# Patient Record
Sex: Male | Born: 1985 | Race: Black or African American | Hispanic: No | Marital: Single | Smoking: Current every day smoker
Health system: Southern US, Community
[De-identification: ages and names within clinical notes are randomized; demographics above are authoritative.]

## PROBLEM LIST (undated history)

## (undated) HISTORY — PX: FOOT SURGERY: SHX648

---
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
## 2003-11-12 ENCOUNTER — Emergency Department (HOSPITAL_COMMUNITY): Admission: EM | Admit: 2003-11-12 | Discharge: 2003-11-13 | Payer: Self-pay | Admitting: Emergency Medicine

## 2007-02-11 ENCOUNTER — Emergency Department (HOSPITAL_COMMUNITY): Admission: EM | Admit: 2007-02-11 | Discharge: 2007-02-11 | Payer: Self-pay | Admitting: Emergency Medicine

## 2007-11-13 ENCOUNTER — Emergency Department (HOSPITAL_COMMUNITY): Admission: EM | Admit: 2007-11-13 | Discharge: 2007-11-13 | Payer: Self-pay | Admitting: Emergency Medicine

## 2008-04-06 ENCOUNTER — Emergency Department (HOSPITAL_COMMUNITY): Admission: EM | Admit: 2008-04-06 | Discharge: 2008-04-07 | Payer: Self-pay | Admitting: Emergency Medicine

## 2009-02-10 ENCOUNTER — Emergency Department (HOSPITAL_COMMUNITY): Admission: EM | Admit: 2009-02-10 | Discharge: 2009-02-10 | Payer: Self-pay | Admitting: Emergency Medicine

## 2009-05-11 ENCOUNTER — Emergency Department (HOSPITAL_COMMUNITY): Admission: EM | Admit: 2009-05-11 | Discharge: 2009-05-11 | Payer: Self-pay | Admitting: Emergency Medicine

## 2009-05-13 ENCOUNTER — Emergency Department (HOSPITAL_COMMUNITY): Admission: EM | Admit: 2009-05-13 | Discharge: 2009-05-13 | Payer: Self-pay | Admitting: Emergency Medicine

## 2010-12-20 ENCOUNTER — Emergency Department (HOSPITAL_COMMUNITY)
Admission: EM | Admit: 2010-12-20 | Discharge: 2010-12-20 | Disposition: A | Discharge status: Home / Self Care | Payer: Self-pay | Attending: Emergency Medicine | Admitting: Emergency Medicine

## 2010-12-20 DIAGNOSIS — L03317 Cellulitis of buttock: Secondary | ICD-10-CM | POA: Insufficient documentation

## 2010-12-20 DIAGNOSIS — L0231 Cutaneous abscess of buttock: Secondary | ICD-10-CM | POA: Insufficient documentation

## 2010-12-22 ENCOUNTER — Emergency Department (HOSPITAL_COMMUNITY)
Admission: EM | Admit: 2010-12-22 | Discharge: 2010-12-22 | Discharge status: Against Medical Advice | Payer: Self-pay | Attending: Emergency Medicine | Admitting: Emergency Medicine

## 2012-08-21 ENCOUNTER — Emergency Department (HOSPITAL_COMMUNITY): Payer: Self-pay

## 2012-08-21 ENCOUNTER — Emergency Department (HOSPITAL_COMMUNITY)
Admission: EM | Admit: 2012-08-21 | Discharge: 2012-08-21 | Disposition: A | Discharge status: Home / Self Care | Payer: Self-pay | Attending: Emergency Medicine | Admitting: Emergency Medicine

## 2012-08-21 DIAGNOSIS — M2392 Unspecified internal derangement of left knee: Secondary | ICD-10-CM

## 2012-08-21 DIAGNOSIS — E669 Obesity, unspecified: Secondary | ICD-10-CM | POA: Insufficient documentation

## 2012-08-21 DIAGNOSIS — M2390 Unspecified internal derangement of unspecified knee: Secondary | ICD-10-CM | POA: Insufficient documentation

## 2012-08-21 MED ORDER — OXYCODONE-ACETAMINOPHEN 5-325 MG PO TABS: 2.0000 | ORAL_TABLET | ORAL | Status: DC | PRN

## 2012-08-21 MED ORDER — OXYCODONE-ACETAMINOPHEN 5-325 MG PO TABS
2.0000 | ORAL_TABLET | Freq: Once | ORAL | Status: AC
  Administered 2012-08-21: 2 via ORAL
  Filled 2012-08-21: qty 2

## 2012-08-21 NOTE — ED Notes (Signed)
Ortho tech at bedside for application of splint and knee immobilizer.

## 2012-08-21 NOTE — ED Notes (Signed)
Pt alert and oriented x4. Respirations even and unlabored, bilateral symmetrical rise and fall of chest. Skin warm and dry. In no acute distress. Denies needs.   

## 2012-08-21 NOTE — ED Notes (Addendum)
Pt states he was playing football and got tackled low on L side of leg. Pt states he has been unable to bear wt on L leg. Pt in wheelchair at triage.

## 2012-08-21 NOTE — ED Provider Notes (Signed)
History     CSN: [id number]  Arrival date & time 08/21/12  1614   First MD Initiated Contact with Patient 08/21/12 1644      No chief complaint on file.   (Consider location/radiation/quality/duration/timing/severity/associated sxs/prior treatment) HPI Comments: Patient was playing football, when he got clipped from side, twisting.  His left knee, which is now painful.  He is unable to bear weight, denies any previous injury to that knee.  Has not taken any medication.  Prior to arrival  The history is provided by the patient.    No past medical history on file.  No past surgical history on file.  No family history on file.  History  Substance Use Topics  . Smoking status: Not on file  . Smokeless tobacco: Not on file  . Alcohol Use: Not on file      Review of Systems  Constitutional: Negative for chills and activity change.  Musculoskeletal: Positive for joint swelling.  Neurological: Negative for weakness and numbness.    Allergies  Review of patient's allergies indicates no known allergies.  Home Medications   Current Outpatient Rx  Name Route Sig Dispense Refill  . OXYCODONE-ACETAMINOPHEN 5-325 MG PO TABS Oral Take 2 tablets by mouth every 4 (four) hours as needed for pain. 30 tablet 0    BP 130/84  Pulse 99  Temp 98.6 F (37 C) (Oral)  Resp 16  SpO2 98%  Physical Exam  Constitutional: He appears well-developed and well-nourished.       Obese  HENT:  Head: Normocephalic.  Eyes: Pupils are equal, round, and reactive to light.  Neck: Normal range of motion.  Cardiovascular: Normal rate.   Pulmonary/Chest: Effort normal.  Musculoskeletal: He exhibits tenderness.       Legs: Neurological: He is alert.  Skin: Skin is warm. No erythema.    ED Course  Procedures (including critical care time)  Labs Reviewed - No data to display Dg Knee Complete 4 Views Left  08/21/2012  *RADIOLOGY REPORT*  Clinical Data: Injury  LEFT KNEE - COMPLETE 4+ VIEW   Comparison: None.  Findings: Four views of the left knee submitted.  No acute fracture or subluxation.  No joint effusion.  No radiopaque foreign body.  IMPRESSION: No acute fracture or subluxation.   Original Report Authenticated By: Natasha Mead, M.D.      1. Internal derangement of left knee       MDM  Knee injury  X-rays were reviewed, revealing no fracture, effusion, or subluxation.  We'll treat as an internal derangement with pain medication, immobilization, and orthopedic followup        Arman Filter, NP 08/21/12 1931

## 2012-08-22 NOTE — ED Provider Notes (Signed)
Medical screening examination/treatment/procedure(s) were performed by non-physician practitioner and as supervising physician I was immediately available for consultation/collaboration.  Toshiko Kemler R. Stevin Bielinski, MD 08/22/12 0013 

## 2012-12-11 ENCOUNTER — Emergency Department (HOSPITAL_COMMUNITY)
Admission: EM | Admit: 2012-12-11 | Discharge: 2012-12-11 | Disposition: A | Discharge status: Home / Self Care | Payer: Self-pay | Attending: Emergency Medicine | Admitting: Emergency Medicine

## 2012-12-11 ENCOUNTER — Encounter (HOSPITAL_COMMUNITY): Payer: Self-pay | Admitting: Emergency Medicine

## 2012-12-11 ENCOUNTER — Emergency Department (HOSPITAL_COMMUNITY): Payer: Self-pay

## 2012-12-11 DIAGNOSIS — S8990XA Unspecified injury of unspecified lower leg, initial encounter: Secondary | ICD-10-CM | POA: Insufficient documentation

## 2012-12-11 DIAGNOSIS — S99929A Unspecified injury of unspecified foot, initial encounter: Secondary | ICD-10-CM | POA: Insufficient documentation

## 2012-12-11 DIAGNOSIS — X500XXA Overexertion from strenuous movement or load, initial encounter: Secondary | ICD-10-CM | POA: Insufficient documentation

## 2012-12-11 DIAGNOSIS — Y9361 Activity, american tackle football: Secondary | ICD-10-CM | POA: Insufficient documentation

## 2012-12-11 DIAGNOSIS — Y9239 Other specified sports and athletic area as the place of occurrence of the external cause: Secondary | ICD-10-CM | POA: Insufficient documentation

## 2012-12-11 MED ORDER — HYDROCODONE-ACETAMINOPHEN 5-325 MG PO TABS: 2.0000 | ORAL_TABLET | Freq: Four times a day (QID) | ORAL | Status: DC | PRN

## 2012-12-11 NOTE — ED Notes (Signed)
Pt states that he was playing football about 1800 today and hurt his left knee.  When asked if he fell, pt states "them fat people jumped on me".  Pain 6/10.

## 2012-12-11 NOTE — ED Provider Notes (Signed)
History  This chart was scribed for non-physician practitioner working with Dione Booze, MD by Candelaria Stagers, ED Scribe. This patient was seen in room WTR9/WTR9 and the patient's care was started at 8:00 PM  CSN: [id number]  Arrival date & time 12/11/12  4098   First MD Initiated Contact with Patient 12/11/12 1940      Chief Complaint  Patient presents with  . Knee Pain     The history is provided by the patient. No language interpreter was used.   Steven Nash is a 27 y.o. male who presents to the Emergency Department complaining of sudden onset of left knee pain that started earlier today while playing football.  Pt reports another player jumped on him and he felt two pops of his knee.  He sprained the same knee about two months ago and was given a knee immobilizer, which he has thrown away.  He reports that the pain improved and he did not see an Orthopedist.  Pt is ambulatory, but has increased pain with ambulation.  Nothing improves the pain.  He has no other injuries.  Pt asks for a work note.  He denies any numbness or tingling.  He reports that he has noticed some swelling of the knee.  He denies erythema.   History reviewed. No pertinent past medical history.  History reviewed. No pertinent past surgical history.  History reviewed. No pertinent family history.  History  Substance Use Topics  . Smoking status: Never Smoker   . Smokeless tobacco: Not on file  . Alcohol Use: No      Review of Systems  Musculoskeletal: Positive for arthralgias (left knee pain).  Skin: Negative for wound.  All other systems reviewed and are negative.    Allergies  Review of patient's allergies indicates no known allergies.  Home Medications  No current outpatient prescriptions on file.  BP 129/85  Pulse 95  Temp(Src) 98.1 F (36.7 C) (Oral)  Resp 18  SpO2 99%  Physical Exam  Nursing note and vitals reviewed. Constitutional: He is oriented to person, place, and time. He  appears well-developed and well-nourished. No distress.  HENT:  Head: Normocephalic and atraumatic.  Eyes: EOM are normal.  Neck: Neck supple. No tracheal deviation present.  Cardiovascular: Normal rate.   Pulmonary/Chest: Effort normal. No respiratory distress.  Musculoskeletal: Normal range of motion.       Left knee: He exhibits no erythema, no LCL laxity, normal patellar mobility and no MCL laxity.  Tenderness to palpation along the medial and lateral joint line.  No erythema present.  Negative anterior and posterior drawer test.  Pain with varus and valgus stress. Good dorsal pedis pulses.  Distal sensation intact.   Pain with ROM of the left knee. Mild swelling of the left knee  Neurological: He is alert and oriented to person, place, and time.  Skin: Skin is warm and dry.  Psychiatric: He has a normal mood and affect. His behavior is normal.    ED Course  Procedures   DIAGNOSTIC STUDIES: Oxygen Saturation is 99% on room air, normal by my interpretation.    COORDINATION OF CARE: 6:53 PM Ordered: DG Knee Complete 4 Views 8:05 PM Will provide knee immobilizer and crutches.  Will provide referral to orthopaedist for follow up if pain persists.  Pt understands and agrees.   8:15 PM Ordered: Apply knee immobilizer; Crutches   Labs Reviewed - No data to display Dg Knee Complete 4 Views Left  12/11/2012  *RADIOLOGY REPORT*  Clinical Data: Diffuse knee pain.  Football injury with a popping sensation.  LEFT KNEE - COMPLETE 4+ VIEW  Comparison: 08/21/2012  Findings: Small indistinct knee effusion suspected.  Articular space is well preserved.  No fracture or acute bony findings.  IMPRESSION:  1.  Small indistinct knee effusion.  If symptoms persist despite conservative therapy, MRI followup may be warranted.   Original Report Authenticated By: Gaylyn Rong, M.D.      No diagnosis found.    MDM  Patient presenting with left knee pain after injuring his knee playing football  earlier today.  No obvious deformity.  Xray negative aside from a knee effusion.  Patient neurovascularly intact.  Patient given knee immobilizer, crutches, and instructed to follow up with Orthopedics.  I personally performed the services described in this documentation, which was scribed in my presence. The recorded information has been reviewed and is accurate.        Pascal Lux [place], PA-C 12/11/12 2233

## 2012-12-11 NOTE — ED Provider Notes (Signed)
Medical screening examination/treatment/procedure(s) were performed by non-physician practitioner and as supervising physician I was immediately available for consultation/collaboration.   Dione Booze, MD 12/11/12 2241

## 2012-12-11 NOTE — ED Notes (Signed)
Ortho tech called and made aware of pt needs for knee immobilizer and crutches

## 2015-07-17 ENCOUNTER — Emergency Department (HOSPITAL_COMMUNITY)
Admission: EM | Admit: 2015-07-17 | Discharge: 2015-07-18 | Disposition: A | Discharge status: Home / Self Care | Payer: Self-pay | Attending: Emergency Medicine | Admitting: Emergency Medicine

## 2015-07-17 ENCOUNTER — Encounter (HOSPITAL_COMMUNITY): Payer: Self-pay | Admitting: Emergency Medicine

## 2015-07-17 DIAGNOSIS — R040 Epistaxis: Secondary | ICD-10-CM | POA: Insufficient documentation

## 2015-07-17 NOTE — ED Notes (Addendum)
Pt c/o epistaxis x3 weeks, lasting 10 minutes in length. Nose bleeds accompanied with dizziness and HA. Reports last nose bleed was at 1500 today. Pt states before nose bleed left ring finger will start to become numb.

## 2015-07-17 NOTE — ED Notes (Signed)
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 He has been having nosebleeds while at work along with a throbbing headache and a numb ring finger on his left hand. This only occurs when he is at work and in the freezer.

## 2015-07-18 LAB — CBC WITH DIFFERENTIAL/PLATELET
BASOS ABS: 0 10*3/uL (ref 0.0–0.1)
Basophils Relative: 0 %
Eosinophils Absolute: 0.4 10*3/uL (ref 0.0–0.7)
Eosinophils Relative: 5 %
HEMATOCRIT: 42.2 % (ref 39.0–52.0)
HEMOGLOBIN: 13.9 g/dL (ref 13.0–17.0)
LYMPHS PCT: 44 %
Lymphs Abs: 3.6 10*3/uL (ref 0.7–4.0)
MCH: 26.8 pg (ref 26.0–34.0)
MCHC: 32.9 g/dL (ref 30.0–36.0)
MCV: 81.3 fL (ref 78.0–100.0)
MONO ABS: 0.5 10*3/uL (ref 0.1–1.0)
MONOS PCT: 6 %
NEUTROS ABS: 3.6 10*3/uL (ref 1.7–7.7)
NEUTROS PCT: 45 %
Platelets: 280 10*3/uL (ref 150–400)
RBC: 5.19 MIL/uL (ref 4.22–5.81)
RDW: 13.3 % (ref 11.5–15.5)
WBC: 8.1 10*3/uL (ref 4.0–10.5)

## 2015-07-18 NOTE — ED Provider Notes (Signed)
CSN: [id number]     Arrival date & time 07/17/15  1830 History   First MD Initiated Contact with Patient 07/17/15 2338     Chief Complaint  Patient presents with  . Epistaxis  . Dizziness     (Consider location/radiation/quality/duration/timing/severity/associated sxs/prior Treatment) HPI Comments: 29 y.o. Male with no significant past medical history presents for epistaxis.  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  He says this only happens to him while he is working in the freezer and that the episodes last about 10 minutes and then resolved.  He denies ever having this issues before.  He reports that when his nose bleeds it is from both nostrils.  This happened again today and resolved again on its own.  Denies easy bruising or other issue with bleeding.  Patient is a 29 y.o. male presenting with nosebleeds and dizziness.  Epistaxis Associated symptoms: dizziness   Associated symptoms: no congestion, no cough, no fever, no headaches and no sore throat   Dizziness Associated symptoms: no blood in stool, no chest pain, no diarrhea, no headaches, no nausea, no palpitations, no shortness of breath, no vomiting and no weakness     History reviewed. No pertinent past medical history. Past Surgical History  Procedure Laterality Date  . Foot surgery     No family history on file. Social History  Substance Use Topics  . Smoking status: Never Smoker   . Smokeless tobacco: None  . Alcohol Use: Yes    Review of Systems  Constitutional: Negative for fever, chills, activity change, appetite change and fatigue.  HENT: Positive for nosebleeds. Negative for congestion, postnasal drip, rhinorrhea and sore throat.   Eyes: Negative for pain and redness.  Respiratory: Negative for cough, chest tightness and shortness of breath.    Cardiovascular: Negative for chest pain and palpitations.  Gastrointestinal: Negative for nausea, vomiting, abdominal pain, diarrhea and blood in stool.  Genitourinary: Negative for hematuria.  Neurological: Positive for dizziness and light-headedness. Negative for seizures, weakness, numbness and headaches.  Hematological: Negative for adenopathy. Does not bruise/bleed easily.      Allergies  Review of patient's allergies indicates no known allergies.  Home Medications   Prior to Admission medications   Medication Sig Start Date End Date Taking? Authorizing Provider  HYDROcodone-acetaminophen (NORCO/VICODIN) 5-325 MG per tablet Take 2 tablets by mouth every 6 (six) hours as needed for pain. 12/11/12   Heather Laisure, PA-C   BP 138/87 mmHg  Pulse 66  Temp(Src) 98 F (36.7 C) (Oral)  Resp 18  SpO2 100% Physical Exam  Constitutional: He is oriented to person, place, and time. He appears well-developed and well-nourished. No distress.  HENT:  Head: Normocephalic and atraumatic.  Right Ear: External ear normal.  Left Ear: External ear normal.  Nose: Nose normal. No mucosal edema, rhinorrhea, nose lacerations, nasal deformity or nasal septal hematoma. No epistaxis.  Mouth/Throat: Oropharynx is clear and moist. No oropharyngeal exudate.  Nostrils clear without sign of epistaxis.  No friable blood vessels noted.  No blood in the posterior pharynx.  Cardiovascular: Normal rate, regular rhythm, normal heart sounds and intact distal pulses.   No murmur heard. Pulmonary/Chest: Effort normal. No respiratory distress. He has no wheezes. He has no rales.  Abdominal: Soft. He exhibits no distension. There is no tenderness.  Musculoskeletal: Normal range of motion. He exhibits no edema or tenderness.  Neurological: He is alert and oriented to person, place, and time.  Skin: Skin is warm and dry. No bruising, no petechiae and no rash noted. He is not diaphoretic.  Vitals reviewed.   ED  Course  Procedures (including critical care time) Labs Review Labs Reviewed  CBC WITH DIFFERENTIAL/PLATELET    Imaging Review No results found. I have personally reviewed and evaluated these images and lab results as part of my medical decision-making.   EKG Interpretation None      MDM  Patient was seen and evaluated in stable condition.  Normal examination.  Mild tenderness of the maxillary and frontal sinuses.  No active epistaxis.  Episodes seem to be environmental induced.  Patient stable.  CBC normal.  Patient was discharged home in stable condition with instruction to follow up with ENT.  Strict return precautions given. Final diagnoses:  Epistaxis    1. Epistaxis, recurrent, currently resolved    Leta Baptist, MD 07/18/15 0236

## 2015-07-18 NOTE — Discharge Instructions (Signed)
Nosebleed Nosebleeds can be caused by many conditions, including trauma, infections, polyps, foreign bodies, dry mucous membranes or climate, medicines, and air conditioning. Most nosebleeds occur in the front of the nose. Because of this location, most nosebleeds can be controlled by pinching the nostrils gently and continuously for at least 10 to 20 minutes. The long, continuous pressure allows enough time for the blood to clot. If pressure is released during that 10 to 20 minute time period, the process may have to be started again. The nosebleed may stop by itself or quit with pressure, or it may need concentrated heating (cautery) or pressure from packing. HOME CARE INSTRUCTIONS   If your nose was packed, try to maintain the pack inside until your health care provider removes it. If a gauze pack was used and it starts to fall out, gently replace it or cut the end off. Do not cut if a balloon catheter was used to pack the nose. Otherwise, do not remove unless instructed.  Avoid blowing your nose for 12 hours after treatment. This could dislodge the pack or clot and start the bleeding again.  If the bleeding starts again, sit up and bend forward, gently pinching the front half of your nose continuously for 20 minutes.  If bleeding was caused by dry mucous membranes, use over-the-counter saline nasal spray or gel. This will keep the mucous membranes moist and allow them to heal. If you must use a lubricant, choose the water-soluble variety. Use it only sparingly and not within several hours of lying down.  Maintain humidity in your home by using less air conditioning or by using a humidifier.  Do not use aspirin or medicines which make bleeding more likely. Your health care provider can give you recommendations on this.  Resume normal activities as you are able, but try to avoid straining, lifting, or bending at the waist for several days.  If the nosebleeds become recurrent and the cause is  unknown, your health care provider may suggest laboratory tests.  You may use over the counter nasal sprays to keep the inside of your nose moist, wear a mask to keep your face warm SEEK MEDICAL CARE IF: You have a fever. SEEK IMMEDIATE MEDICAL CARE IF:   Bleeding recurs and cannot be controlled.  There is unusual bleeding from or bruising on other parts of the body.  Nosebleeds continue.  There is any worsening of the condition which originally brought you in.  You become light-headed, feel faint, become sweaty, or vomit blood. MAKE SURE YOU:   Understand these instructions.  Will watch your condition.  Will get help right away if you are not doing well or get worse. Document Released: 07/29/2005 Document Revised: 03/05/2014 Document Reviewed: 09/19/2009 [facility] Patient Information 2015 [place], [place]. This information is not intended to replace advice given to you by your health care provider. Make sure you discuss any questions you have with your health care provider.

## 2015-09-12 ENCOUNTER — Encounter (HOSPITAL_COMMUNITY): Payer: Self-pay | Admitting: *Deleted

## 2015-09-12 ENCOUNTER — Emergency Department (HOSPITAL_COMMUNITY): Payer: Managed Care, Other (non HMO)

## 2015-09-12 ENCOUNTER — Emergency Department (HOSPITAL_COMMUNITY)
Admission: EM | Admit: 2015-09-12 | Discharge: 2015-09-12 | Disposition: A | Discharge status: Home / Self Care | Payer: Managed Care, Other (non HMO) | Attending: Emergency Medicine | Admitting: Emergency Medicine

## 2015-09-12 DIAGNOSIS — R944 Abnormal results of kidney function studies: Secondary | ICD-10-CM | POA: Insufficient documentation

## 2015-09-12 DIAGNOSIS — R05 Cough: Secondary | ICD-10-CM | POA: Diagnosis not present

## 2015-09-12 DIAGNOSIS — R0602 Shortness of breath: Secondary | ICD-10-CM | POA: Insufficient documentation

## 2015-09-12 DIAGNOSIS — R519 Headache, unspecified: Secondary | ICD-10-CM

## 2015-09-12 DIAGNOSIS — Z720 Tobacco use: Secondary | ICD-10-CM | POA: Insufficient documentation

## 2015-09-12 DIAGNOSIS — Z79899 Other long term (current) drug therapy: Secondary | ICD-10-CM | POA: Diagnosis not present

## 2015-09-12 DIAGNOSIS — R062 Wheezing: Secondary | ICD-10-CM | POA: Insufficient documentation

## 2015-09-12 DIAGNOSIS — R002 Palpitations: Secondary | ICD-10-CM | POA: Insufficient documentation

## 2015-09-12 DIAGNOSIS — R51 Headache: Secondary | ICD-10-CM | POA: Insufficient documentation

## 2015-09-12 DIAGNOSIS — R040 Epistaxis: Secondary | ICD-10-CM | POA: Insufficient documentation

## 2015-09-12 DIAGNOSIS — R079 Chest pain, unspecified: Secondary | ICD-10-CM | POA: Diagnosis present

## 2015-09-12 DIAGNOSIS — R7989 Other specified abnormal findings of blood chemistry: Secondary | ICD-10-CM

## 2015-09-12 LAB — I-STAT TROPONIN, ED: TROPONIN I, POC: 0 ng/mL (ref 0.00–0.08)

## 2015-09-12 LAB — BASIC METABOLIC PANEL
ANION GAP: 11 (ref 5–15)
BUN: 14 mg/dL (ref 6–20)
CALCIUM: 9.5 mg/dL (ref 8.9–10.3)
CO2: 23 mmol/L (ref 22–32)
Chloride: 105 mmol/L (ref 101–111)
Creatinine, Ser: 1.34 mg/dL — ABNORMAL HIGH (ref 0.61–1.24)
GFR calc Af Amer: >60 mL/min (ref >60)
GFR calc non Af Amer: >60 mL/min (ref >60)
GLUCOSE: 108 mg/dL — AB (ref 65–99)
Potassium: 4 mmol/L (ref 3.5–5.1)
Sodium: 139 mmol/L (ref 135–145)

## 2015-09-12 LAB — CBC
HCT: 41.2 % (ref 39.0–52.0)
HEMOGLOBIN: 13.4 g/dL (ref 13.0–17.0)
MCH: 26.4 pg (ref 26.0–34.0)
MCHC: 32.5 g/dL (ref 30.0–36.0)
MCV: 81.1 fL (ref 78.0–100.0)
Platelets: 277 10*3/uL (ref 150–400)
RBC: 5.08 MIL/uL (ref 4.22–5.81)
RDW: 13.3 % (ref 11.5–15.5)
WBC: 6.3 10*3/uL (ref 4.0–10.5)

## 2015-09-12 MED ORDER — ALBUTEROL SULFATE HFA 108 (90 BASE) MCG/ACT IN AERS: 2.0000 | INHALATION_SPRAY | RESPIRATORY_TRACT | Status: AC | PRN

## 2015-09-12 MED ORDER — DIPHENHYDRAMINE HCL 50 MG/ML IJ SOLN
25.0000 mg | Freq: Once | INTRAMUSCULAR | Status: AC
  Administered 2015-09-12: 25 mg via INTRAVENOUS
  Filled 2015-09-12: qty 1

## 2015-09-12 MED ORDER — ALBUTEROL SULFATE HFA 108 (90 BASE) MCG/ACT IN AERS
2.0000 | INHALATION_SPRAY | Freq: Once | RESPIRATORY_TRACT | Status: DC
  Filled 2015-09-12: qty 6.7

## 2015-09-12 MED ORDER — SODIUM CHLORIDE 0.9 % IV BOLUS (SEPSIS)
1000.0000 mL | Freq: Once | INTRAVENOUS | Status: AC
  Administered 2015-09-12: 1000 mL via INTRAVENOUS

## 2015-09-12 MED ORDER — ALBUTEROL SULFATE (2.5 MG/3ML) 0.083% IN NEBU
5.0000 mg | INHALATION_SOLUTION | Freq: Once | RESPIRATORY_TRACT | Status: AC
  Administered 2015-09-12: 5 mg via RESPIRATORY_TRACT
  Filled 2015-09-12: qty 6

## 2015-09-12 MED ORDER — IPRATROPIUM BROMIDE 0.02 % IN SOLN
0.5000 mg | Freq: Once | RESPIRATORY_TRACT | Status: AC
  Administered 2015-09-12: 0.5 mg via RESPIRATORY_TRACT
  Filled 2015-09-12: qty 2.5

## 2015-09-12 MED ORDER — METOCLOPRAMIDE HCL 5 MG/ML IJ SOLN
10.0000 mg | Freq: Once | INTRAMUSCULAR | Status: AC
  Administered 2015-09-12: 10 mg via INTRAVENOUS
  Filled 2015-09-12: qty 2

## 2015-09-12 NOTE — ED Notes (Signed)
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  The 2nd nose bleed was accompanied by a headache in his temples, chest pain, diaphoresis and increased chest pain when he tried to take a deep breath.

## 2015-09-12 NOTE — ED Notes (Signed)
Patient left at this time with all belongings. 

## 2015-09-12 NOTE — Discharge Instructions (Signed)
Nonspecific Chest Pain  Chest pain can be caused by many different conditions. There is always a chance that your pain could be related to something serious, such as a heart attack or a blood clot in your lungs. Chest pain can also be caused by conditions that are not life-threatening. If you have chest pain, it is very important to follow up with your health care provider. CAUSES  Chest pain can be caused by:  Heartburn.  Pneumonia or bronchitis.  Anxiety or stress.  Inflammation around your heart (pericarditis) or lung (pleuritis or pleurisy).  A blood clot in your lung.  A collapsed lung (pneumothorax). It can develop suddenly on its own (spontaneous pneumothorax) or from trauma to the chest.  Shingles infection (varicella-zoster virus).  Heart attack.  Damage to the bones, muscles, and cartilage that make up your chest wall. This can include:  Bruised bones due to injury.  Strained muscles or cartilage due to frequent or repeated coughing or overwork.  Fracture to one or more ribs.  Sore cartilage due to inflammation (costochondritis). RISK FACTORS  Risk factors for chest pain may include:  Activities that increase your risk for trauma or injury to your chest.  Respiratory infections or conditions that cause frequent coughing.  Medical conditions or overeating that can cause heartburn.  Heart disease or family history of heart disease.  Conditions or health behaviors that increase your risk of developing a blood clot.  Having had chicken pox (varicella zoster). SIGNS AND SYMPTOMS Chest pain can feel like:  Burning or tingling on the surface of your chest or deep in your chest.  Crushing, pressure, aching, or squeezing pain.  Dull or sharp pain that is worse when you move, cough, or take a deep breath.  Pain that is also felt in your back, neck, shoulder, or arm, or pain that spreads to any of these areas. Your chest pain may come and go, or it may stay  constant. DIAGNOSIS Lab tests or other studies may be needed to find the cause of your pain. Your health care provider may have you take a test called an ambulatory ECG (electrocardiogram). An ECG records your heartbeat patterns at the time the test is performed. You may also have other tests, such as:  Transthoracic echocardiogram (TTE). During echocardiography, sound waves are used to create a picture of all of the heart structures and to look at how blood flows through your heart.  Transesophageal echocardiogram (TEE).This is a more advanced imaging test that obtains images from inside your body. It allows your health care provider to see your heart in finer detail.  Cardiac monitoring. This allows your health care provider to monitor your heart rate and rhythm in real time.  Holter monitor. This is a portable device that records your heartbeat and can help to diagnose abnormal heartbeats. It allows your health care provider to track your heart activity for several days, if needed.  Stress tests. These can be done through exercise or by taking medicine that makes your heart beat more quickly.  Blood tests.  Imaging tests. TREATMENT  Your treatment depends on what is causing your chest pain. Treatment may include:  Medicines. These may include:  Acid blockers for heartburn.  Anti-inflammatory medicine.  Pain medicine for inflammatory conditions.  Antibiotic medicine, if an infection is present.  Medicines to dissolve blood clots.  Medicines to treat coronary artery disease.  Supportive care for conditions that do not require medicines. This may include:  Resting.  Applying heat  or cold packs to injured areas.  Limiting activities until pain decreases. HOME CARE INSTRUCTIONS  If you were prescribed an antibiotic medicine, finish it all even if you start to feel better.  Avoid any activities that bring on chest pain.  Do not use any tobacco products, including  cigarettes, chewing tobacco, or electronic cigarettes. If you need help quitting, ask your health care provider.  Do not drink alcohol.  Take medicines only as directed by your health care provider.  Keep all follow-up visits as directed by your health care provider. This is important. This includes any further testing if your chest pain does not go away.  If heartburn is the cause for your chest pain, you may be told to keep your head raised (elevated) while sleeping. This reduces the chance that acid will go from your stomach into your esophagus.  Make lifestyle changes as directed by your health care provider. These may include:  Getting regular exercise. Ask your health care provider to suggest some activities that are safe for you.  Eating a heart-healthy diet. A registered dietitian can help you to learn healthy eating options.  Maintaining a healthy weight.  Managing diabetes, if necessary.  Reducing stress. SEEK MEDICAL CARE IF:  Your chest pain does not go away after treatment.  You have a rash with blisters on your chest.  You have a fever. SEEK IMMEDIATE MEDICAL CARE IF:   Your chest pain is worse.  You have an increasing cough, or you cough up blood.  You have severe abdominal pain.  You have severe weakness.  You faint.  You have chills.  You have sudden, unexplained chest discomfort.  You have sudden, unexplained discomfort in your arms, back, neck, or jaw.  You have shortness of breath at any time.  You suddenly start to sweat, or your skin gets clammy.  You feel nauseous or you vomit.  You suddenly feel light-headed or dizzy.  Your heart begins to beat quickly, or it feels like it is skipping beats. These symptoms may represent a serious problem that is an emergency. Do not wait to see if the symptoms will go away. Get medical help right away. Call your local emergency services (911 in the [place] Do not drive yourself to the hospital.   This  information is not intended to replace advice given to you by your health care provider. Make sure you discuss any questions you have with your health care provider.   Document Released: 07/29/2005 Document Revised: 11/09/2014 Document Reviewed: 05/25/2014 Elsevier Interactive Patient Education 2016 [employer] Bronchospasm, Adult A bronchospasm is when the tubes that carry air in and out of your lungs (airways) spasm or tighten. During a bronchospasm it is hard to breathe. This is because the airways get smaller. A bronchospasm can be triggered by:  Allergies. These may be to animals, pollen, food, or mold.  Infection. This is a common cause of bronchospasm.  Exercise.  Irritants. These include pollution, cigarette smoke, strong odors, aerosol sprays, and paint fumes.  Weather changes.  Stress.  Being emotional. HOME CARE   Always have a plan for getting help. Know when to call your doctor and local emergency services (911 in the [place] Know where you can get emergency care.  Only take medicines as told by your doctor.  If you were prescribed an inhaler or nebulizer machine, ask your doctor how to use it correctly. Always use a spacer with your inhaler if you were given one.  Stay calm  during an attack. Try to relax and breathe more slowly.  Control your home environment:  Change your heating and air conditioning filter at least once a month.  Limit your use of fireplaces and wood stoves.  Do not  smoke. Do not  allow smoking in your home.  Avoid perfumes and fragrances.  Get rid of pests (such as roaches and mice) and their droppings.  Throw away plants if you see mold on them.  Keep your house clean and dust free.  Replace carpet with wood, tile, or vinyl flooring. Carpet can trap dander and dust.  Use allergy-proof pillows, mattress covers, and box spring covers.  Wash bed sheets and blankets every week in hot water. Dry them in a dryer.  Use blankets that  are made of polyester or cotton.  Wash hands frequently. GET HELP IF:  You have muscle aches.  You have chest pain.  The thick spit you spit or cough up (sputum) changes from clear or white to yellow, green, gray, or bloody.  The thick spit you spit or cough up gets thicker.  There are problems that may be related to the medicine you are given such as:  A rash.  Itching.  Swelling.  Trouble breathing. GET HELP RIGHT AWAY IF:  You feel you cannot breathe or catch your breath.  You cannot stop coughing.  Your treatment is not helping you breathe better.  You have very bad chest pain. MAKE SURE YOU:   Understand these instructions.  Will watch your condition.  Will get help right away if you are not doing well or get worse.   This information is not intended to replace advice given to you by your health care provider. Make sure you discuss any questions you have with your health care provider.   Document Released: 08/16/2009 Document Revised: 11/09/2014 Document Reviewed: 04/11/2013 Elsevier Interactive Patient Education 2016 [employer] General Headache Without Cause A headache is pain or discomfort felt around the head or neck area. The specific cause of a headache may not be found. There are many causes and types of headaches. A few common ones are:  Tension headaches.  Migraine headaches.  Cluster headaches.  Chronic daily headaches. HOME CARE INSTRUCTIONS  Watch your condition for any changes. Take these steps to help with your condition: Managing Pain  Take over-the-counter and prescription medicines only as told by your health care provider.  Lie down in a dark, quiet room when you have a headache.  If directed, apply ice to the head and neck area:  Put ice in a plastic bag.  Place a towel between your skin and the bag.  Leave the ice on for 20 minutes, 2-3 times per day.  Use a heating pad or hot shower to apply heat to the head and neck  area as told by your health care provider.  Keep lights dim if bright lights bother you or make your headaches worse. Eating and Drinking  Eat meals on a regular schedule.  Limit alcohol use.  Decrease the amount of caffeine you drink, or stop drinking caffeine. General Instructions  Keep all follow-up visits as told by your health care provider. This is important.  Keep a headache journal to help find out what may trigger your headaches. For example, write down:  What you eat and drink.  How much sleep you get.  Any change to your diet or medicines.  Try massage or other relaxation techniques.  Limit stress.  Sit up straight,  and do not tense your muscles.  Do not use tobacco products, including cigarettes, chewing tobacco, or e-cigarettes. If you need help quitting, ask your health care provider.  Exercise regularly as told by your health care provider.  Sleep on a regular schedule. Get 7-9 hours of sleep, or the amount recommended by your health care provider. SEEK MEDICAL CARE IF:   Your symptoms are not helped by medicine.  You have a headache that is different from the usual headache.  You have nausea or you vomit.  You have a fever. SEEK IMMEDIATE MEDICAL CARE IF:   Your headache becomes severe.  You have repeated vomiting.  You have a stiff neck.  You have a loss of vision.  You have problems with speech.  You have pain in the eye or ear.  You have muscular weakness or loss of muscle control.  You lose your balance or have trouble walking.  You feel faint or pass out.  You have confusion.   This information is not intended to replace advice given to you by your health care provider. Make sure you discuss any questions you have with your health care provider.   Document Released: 10/19/2005 Document Revised: 07/10/2015 Document Reviewed: 02/11/2015 Elsevier Interactive Patient Education [employer]

## 2015-09-12 NOTE — ED Provider Notes (Signed)
CSN: [id number]     Arrival date & time 09/12/15  1530 History   First MD Initiated Contact with Patient 09/12/15 1536     Chief Complaint  Patient presents with  . Chest Pain  . Headache   Steven Nash is a 29 y.o. male with a history of obesity and marijuana usage presents to the emergency department complaining of a nose bleed earlier today that resolved, then a second brief nose bleed that caused him to develop chest pain, and a headache. 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 He reports today he had a second nosebleed and then developed substernal 7 out of 10 stabbing chest pain that is nonradiating. He also reports an associated 8 out of 10 frontal and throbbing headache. He reports associated cough, intermittent wheezing, and pain with deep inspiration. He reports he feels slightly short of breath currently. He reports some palpitations earlier with this chest pain. The patient reports he does smoke marijuana but not tobacco. Patient denies fevers, chills, recent illness, history of asthma, abdominal pain, nausea, vomiting, leg pain, leg swelling, numbness, tingling, weakness, also bladder control, loss of bowel control, or rashes. Denies personal or close family history of DVTs or PEs. He reports a history with a father with a heart attack at age 31. He denies easy bruising or easy bleeding.  (Consider location/radiation/quality/duration/timing/severity/associated sxs/prior Treatment) HPI  History reviewed. No pertinent past medical history. Past Surgical History  Procedure Laterality Date  . Foot surgery     No family history on file. Social History  Substance Use Topics  . Smoking status: Current Every Day Smoker    Types: Cigarettes  . Smokeless tobacco: None  . Alcohol Use: Yes    Review of Systems  Constitutional: Negative for fever and chills.  HENT: Negative for congestion, sore throat  and trouble swallowing.   Eyes: Negative for visual disturbance.  Respiratory: Positive for cough and shortness of breath. Negative for chest tightness and wheezing.   Cardiovascular: Positive for chest pain and palpitations. Negative for leg swelling.  Gastrointestinal: Negative for nausea, vomiting, abdominal pain and diarrhea.  Genitourinary: Negative for dysuria, urgency, frequency, hematuria, decreased urine volume and difficulty urinating.  Musculoskeletal: Negative for back pain, neck pain and neck stiffness.  Skin: Negative for rash.  Neurological: Positive for headaches. Negative for dizziness, syncope, weakness, light-headedness and numbness.      Allergies  Review of patient's allergies indicates no known allergies.  Home Medications   Prior to Admission medications   Medication Sig Start Date End Date Taking? Authorizing Provider  albuterol (PROVENTIL HFA;VENTOLIN HFA) 108 (90 BASE) MCG/ACT inhaler Inhale 2 puffs into the lungs every 4 (four) hours as needed for wheezing or shortness of breath. 09/12/15   Everlene FarrierWilliam Kaja Jackowski, PA-C   BP 149/89 mmHg  Pulse 89  Temp(Src) 98 F (36.7 C) (Oral)  Resp 14  Ht 5\' 9"  (1.753 m)  Wt 337 lb (152.862 kg)  BMI 49.74 kg/m2  SpO2 100% Physical Exam  Constitutional: He is oriented to person, place, and time. He appears well-developed and well-nourished. No distress.  Nontoxic appearing.  HENT:  Head: Normocephalic and atraumatic.  Mouth/Throat: Oropharynx is clear and moist.  Eyes: Conjunctivae and EOM are normal. Pupils are equal, round, and reactive to light. Right eye exhibits no discharge. Left eye exhibits no discharge.  Neck: Neck supple.  Cardiovascular: Normal rate, regular rhythm, normal heart sounds and  intact distal pulses.  Exam reveals no gallop and no friction rub.   No murmur heard. Pulmonary/Chest: Effort normal and breath sounds normal. No respiratory distress. He has no wheezes. He has no rales. He exhibits  tenderness.  Lungs clear to auscultation bilaterally. Chest wall is tender to palpation and reproduces his chest pain.   Abdominal: Soft. Bowel sounds are normal. He exhibits no distension. There is no tenderness. There is no guarding.  Abdomen is soft and nontender to palpation.  Musculoskeletal: Normal range of motion. He exhibits no edema or tenderness.  No lower extremity edema or tenderness.  Lymphadenopathy:    He has no cervical adenopathy.  Neurological: He is alert and oriented to person, place, and time. No cranial nerve deficit. Coordination normal.  The patient is alert and oriented 3. Cranial nerves are intact. Vision is grossly intact. Sensation intact in his bilateral upper and lower extremities.  Skin: Skin is warm and dry. No rash noted. He is not diaphoretic. No erythema. No pallor.  Psychiatric: He has a normal mood and affect. His behavior is normal.  Nursing note and vitals reviewed.   ED Course  Procedures (including critical care time) Labs Review Labs Reviewed  BASIC METABOLIC PANEL - Abnormal; Notable for the following:    Glucose, Bld 108 (*)    Creatinine, Ser 1.34 (*)    All other components within normal limits  CBC  I-STAT TROPOININ, ED  I-STAT TROPOININ, ED    Imaging Review Dg Chest 2 View  09/12/2015  CLINICAL DATA:  Acute onset of left-sided chest pain, shortness of breath and epistaxis earlier today. EXAM: CHEST  2 VIEW COMPARISON:  05/11/2009, 02/10/2009. FINDINGS: Cardiomediastinal silhouette unremarkable, unchanged. Mild eventration of the left anterior hemidiaphragm, unchanged. Lungs clear. Bronchovascular markings normal. Pulmonary vascularity normal. No visible pleural effusions. No pneumothorax. Visualized bony thorax intact. IMPRESSION: No acute cardiopulmonary disease.  Stable examination. Electronically Signed   By: Hulan Saas M.D.   On: 09/12/2015 16:14   I have personally reviewed and evaluated these images and lab results as part  of my medical decision-making.   EKG Interpretation   Date/Time:  Thursday September 12 2015 15:43:42 EST Ventricular Rate:  81 PR Interval:  169 QRS Duration: 103 QT Interval:  366 QTC Calculation: 425 R Axis:   -17 Text Interpretation:  Sinus arrhythmia Multiple ventricular premature  complexes Borderline left axis deviation RSR' in V1 or V2, probably normal  variant Borderline ST elevation, anterior leads Premature ventricular  complexes New since previous tracing Confirmed by JACUBOWITZ  MD, SAM  [id number]) on 09/12/2015 3:52:59 PM      Filed Vitals:   09/12/15 1536 09/12/15 1600 09/12/15 1724 09/12/15 1731  BP: 136/89 136/102  149/89  Pulse: 85 80 91 89  Temp: 98 F (36.7 C)     TempSrc: Oral     Resp: Height:  (1.753 m)     Weight: 337 lb (152.862 kg)     SpO2: 95% 93% 97% 100%     MDM   Meds given in ED:  Medications  albuterol (PROVENTIL HFA;VENTOLIN HFA) 108 (90 BASE) MCG/ACT inhaler 2 puff (not administered)  sodium chloride 0.9 % bolus 1,000 mL (1,000 mLs Intravenous New Bag/Given 09/12/15 1734)  metoCLOPramide (REGLAN) injection 10 mg (10 mg Intravenous Given 09/12/15 1735)  diphenhydrAMINE (BENADRYL) injection 25 mg (25 mg Intravenous Given 09/12/15 1734)  albuterol (PROVENTIL) (2.5 MG/3ML) 0.083% nebulizer solution 5 mg (5 mg Nebulization Given 09/12/15  1722)  ipratropium (ATROVENT) nebulizer solution 0.5 mg (0.5 mg Nebulization Given 09/12/15 1721)    New Prescriptions   ALBUTEROL (PROVENTIL HFA;VENTOLIN HFA) 108 (90 BASE) MCG/ACT INHALER    Inhale 2 puffs into the lungs every 4 (four) hours as needed for wheezing or shortness of breath.    Final diagnoses:  Chest pain, unspecified chest pain type  Bad headache  Elevated serum creatinine   This is a 29 y.o. male with a history of obesity and marijuana usage presents to the emergency department complaining of a nose bleed earlier today that resolved, then a second brief nose bleed that  caused him to develop chest pain, and a headache. 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 He reports today he had a second nosebleed and then developed substernal 7 out of 10 stabbing chest pain that is nonradiating. He also reports an associated 8 out of 10 frontal and throbbing headache. He reports associated cough, intermittent wheezing, and pain with deep inspiration. He reports he feels slightly short of breath currently. He reports some palpitations earlier with this chest pain. Patient presented with chest pain to the ED. Patient is to be discharged with recommendation to follow up with PCP in regards to today's hospital visit. Chest pain is not likely of cardiac or pulmonary etiology due to presentation, perc negative, VSS, no tracheal deviation, no JVD or new murmur, RRR, breath sounds equal bilaterally, EKG without acute abnormalities, negative troponin, his chest pain is reproducible on palpation of his chest, and negative CXR. HEART score is 2. Patient's headache also was treated and resolved in the emergency department. His creatinine was slightly elevated at 1.34. No old blood work to compare. I discussed this finding with the patient and advised him he needs to follow-up with his primary care doctor to have this rechecked. I encouraged him to drink 8 glasses of water per day. I encouraged him to use saline nasal spray and stay hydrated help his nosebleeds going in the freezer. Patient reports his chest pain, headache and shortness of breath have all completely resolved prior to discharge. He is tolerated by mouth liquids and food. He reports feeling ready for discharge. Patient has been advised to return to the ED if chest pain becomes exertional, associated with diaphoresis or nausea, radiates to left jaw/arm, worsens or becomes concerning in any way. Patient appears reliable for follow up and is agreeable to discharge.  I advised the patient to follow-up with their primary care provider this week. I advised the patient to return to the emergency department with new or worsening symptoms or new concerns. The patient verbalized understanding and agreement with plan.    This patient was discussed with Dr. Ethelda Chick who agrees with assessment and plan.      Everlene Farrier, PA-C 09/12/15 1952  Doug Sou, MD 09/13/15 [phone]

## 2015-12-18 ENCOUNTER — Ambulatory Visit
Admission: RE | Admit: 2015-12-18 | Discharge: 2015-12-18 | Disposition: A | Discharge status: Home / Self Care | Payer: No Typology Code available for payment source | Source: Ambulatory Visit | Attending: Infectious Disease | Admitting: Infectious Disease

## 2015-12-18 ENCOUNTER — Other Ambulatory Visit: Payer: Self-pay | Admitting: Infectious Disease

## 2015-12-18 DIAGNOSIS — R7611 Nonspecific reaction to tuberculin skin test without active tuberculosis: Secondary | ICD-10-CM

## 2016-04-14 ENCOUNTER — Encounter (HOSPITAL_COMMUNITY): Payer: Self-pay

## 2016-04-14 ENCOUNTER — Emergency Department (HOSPITAL_COMMUNITY)
Admission: EM | Admit: 2016-04-14 | Discharge: 2016-04-14 | Disposition: A | Discharge status: Home / Self Care | Payer: No Typology Code available for payment source | Attending: Emergency Medicine | Admitting: Emergency Medicine

## 2016-04-14 DIAGNOSIS — F17210 Nicotine dependence, cigarettes, uncomplicated: Secondary | ICD-10-CM | POA: Insufficient documentation

## 2016-04-14 DIAGNOSIS — J028 Acute pharyngitis due to other specified organisms: Secondary | ICD-10-CM | POA: Insufficient documentation

## 2016-04-14 DIAGNOSIS — J029 Acute pharyngitis, unspecified: Secondary | ICD-10-CM

## 2016-04-14 DIAGNOSIS — R599 Enlarged lymph nodes, unspecified: Secondary | ICD-10-CM | POA: Insufficient documentation

## 2016-04-14 DIAGNOSIS — B9689 Other specified bacterial agents as the cause of diseases classified elsewhere: Secondary | ICD-10-CM | POA: Insufficient documentation

## 2016-04-14 DIAGNOSIS — Z79899 Other long term (current) drug therapy: Secondary | ICD-10-CM | POA: Insufficient documentation

## 2016-04-14 LAB — RAPID STREP SCREEN (MED CTR MEBANE ONLY): STREPTOCOCCUS, GROUP A SCREEN (DIRECT): NEGATIVE

## 2016-04-14 MED ORDER — IBUPROFEN 800 MG PO TABS: 800.0000 mg | ORAL_TABLET | Freq: Three times a day (TID) | ORAL | Status: AC

## 2016-04-14 NOTE — ED Provider Notes (Signed)
CSN: [id number]     Arrival date & time 04/14/16  1811 History  By signing my name below, I, Iona BeardChristian [id number], attest that this documentation has been prepared under the direction and in the presence of General MillsBenjamin Shikita Vaillancourt, PA-C.   Electronically Signed: Iona Beardhristian Pulliam, ED Scribe 04/14/2016 at 7:46 PM.   Chief Complaint  Patient presents with  . Sore Throat   The history is provided by the patient. No language interpreter was used.   HPI Comments: Steven Adamravis A Nash is a 30 y.o. male who presents to the Emergency Department complaining of gradual onset, sore throat, ongoing for about five days. Pt reports associated productive cough with green phlegm. No other associated symptoms noted. Pt's pain is worse with swallowing. Pt has tried several OTC medications and hot tea with no relief to his pain. No other worsening or alleviating factors noted. Pt denies difficulty breathing, fevers, chills, nausea, emesis, or any other pertinent symptoms.  History reviewed. No pertinent past medical history. Past Surgical History  Procedure Laterality Date  . Foot surgery     History reviewed. No pertinent family history. Social History  Substance Use Topics  . Smoking status: Current Every Day Smoker    Types: Cigarettes  . Smokeless tobacco: None  . Alcohol Use: Yes    Review of Systems A complete 10 system review of systems was obtained and all systems are negative except as noted in the HPI and PMH.    Allergies  Review of patient's allergies indicates no known allergies.  Home Medications   Prior to Admission medications   Medication Sig Start Date End Date Taking? Authorizing Provider  albuterol (PROVENTIL HFA;VENTOLIN HFA) 108 (90 BASE) MCG/ACT inhaler Inhale 2 puffs into the lungs every 4 (four) hours as needed for wheezing or shortness of breath. 09/12/15   Everlene FarrierWilliam Dansie, PA-C  ibuprofen (ADVIL,MOTRIN) 800 MG tablet Take 1 tablet (800 mg total) by mouth 3 (three) times daily. 04/14/16    Creedon Danielski, PA-C   BP 148/97 mmHg  Pulse 85  Temp(Src) 98.1 F (36.7 C) (Oral)  Resp 20  SpO2 97% Physical Exam  Constitutional: He appears well-developed and well-nourished. No distress.  HENT:  Head: Normocephalic and atraumatic.  Mouth/Throat: Oropharynx is clear and moist. No trismus in the jaw. No oropharyngeal exudate.  Oropharynx clear and moist. No trismus. No unilateral tonsillar swelling. Mild cervical lymphadenopathy.   Eyes: Conjunctivae and EOM are normal.  Neck: Neck supple. No tracheal deviation present.  Cardiovascular: Normal rate, regular rhythm and normal heart sounds.  Exam reveals no gallop.   No murmur heard. Pulmonary/Chest: Effort normal and breath sounds normal. No respiratory distress. He has no wheezes. He has no rales.  Abdominal: Soft. He exhibits no distension. There is no tenderness. There is no rebound and no guarding.  Musculoskeletal: Normal range of motion.  Lymphadenopathy:    He has cervical adenopathy.  Neurological: He is alert.  Skin: Skin is warm and dry.  Psychiatric: He has a normal mood and affect. His behavior is normal.    ED Course  Procedures (including critical care time) DIAGNOSTIC STUDIES: Oxygen Saturation is 97% on RA, normal by my interpretation.    COORDINATION OF CARE: 7:28 PM Discussed treatment plan with pt at bedside and pt agreed to plan.  Labs Review Labs Reviewed  RAPID STREP [facility]EN (NOT [facility])  CULTURE, GROUP A STREP [facility])    Imaging Review No results found. I have personally reviewed and evaluated these lab results as part  of my medical decision-making.   EKG Interpretation None      MDM   Pt afebrile without tonsillar exudate, negative strep. Presents with mild cervical lymphadenopathy, & dysphagia; diagnosis of viral pharyngitis. No abx indicated. DC w symptomatic tx for pain  Pt does not appear dehydrated, but did discuss importance of water rehydration. Presentation non concerning for  PTA or infxn spread to soft tissue. No trismus or uvula deviation. Specific return precautions discussed. Pt able to drink water in ED without difficulty with intact air way. Recommended PCP follow up.  Final diagnoses:  Viral pharyngitis   I personally performed the services described in this documentation, which was scribed in my presence. The recorded information has been reviewed and is accurate.      Joycie Peek, PA-C 04/14/16 1947  Arby Barrette, MD 04/18/16 [phone]

## 2016-04-14 NOTE — Discharge Instructions (Signed)
Your strep test was negative. Your symptoms are likely due to a virus that should resolve on their own over the next 3-10 days. Continue taking Motrin as we discussed. You may also use salt water gargles, Cepacol lozenges to help with your discomfort. Follow-up with your doctor as needed. Return to ED for new or worsening symptoms.  Pharyngitis Pharyngitis is redness, pain, and swelling (inflammation) of your pharynx.  CAUSES  Pharyngitis is usually caused by infection. Most of the time, these infections are from viruses (viral) and are part of a cold. However, sometimes pharyngitis is caused by bacteria (bacterial). Pharyngitis can also be caused by allergies. Viral pharyngitis may be spread from person to person by coughing, sneezing, and personal items or utensils (cups, forks, spoons, toothbrushes). Bacterial pharyngitis may be spread from person to person by more intimate contact, such as kissing.  SIGNS AND SYMPTOMS  Symptoms of pharyngitis include:   Sore throat.   Tiredness (fatigue).   Low-grade fever.   Headache.  Joint pain and muscle aches.  Skin rashes.  Swollen lymph nodes.  Plaque-like film on throat or tonsils (often seen with bacterial pharyngitis). DIAGNOSIS  Your health care provider will ask you questions about your illness and your symptoms. Your medical history, along with a physical exam, is often all that is needed to diagnose pharyngitis. Sometimes, a rapid strep test is done. Other lab tests may also be done, depending on the suspected cause.  TREATMENT  Viral pharyngitis will usually get better in 3-4 days without the use of medicine. Bacterial pharyngitis is treated with medicines that kill germs (antibiotics).  HOME CARE INSTRUCTIONS   Drink enough water and fluids to keep your urine clear or pale yellow.   Only take over-the-counter or prescription medicines as directed by your health care provider:   If you are prescribed antibiotics, make sure  you finish them even if you start to feel better.   Do not take aspirin.   Get lots of rest.   Gargle with 8 oz of salt water ( tsp of salt per 1 qt of water) as often as every 1-2 hours to soothe your throat.   Throat lozenges (if you are not at risk for choking) or sprays may be used to soothe your throat. SEEK MEDICAL CARE IF:   You have large, tender lumps in your neck.  You have a rash.  You cough up green, yellow-brown, or bloody spit. SEEK IMMEDIATE MEDICAL CARE IF:   Your neck becomes stiff.  You drool or are unable to swallow liquids.  You vomit or are unable to keep medicines or liquids down.  You have severe pain that does not go away with the use of recommended medicines.  You have trouble breathing (not caused by a stuffy nose). MAKE SURE YOU:   Understand these instructions.  Will watch your condition.  Will get help right away if you are not doing well or get worse.   This information is not intended to replace advice given to you by your health care provider. Make sure you discuss any questions you have with your health care provider.   Document Released: 10/19/2005 Document Revised: 08/09/2013 Document Reviewed: 06/26/2013 Elsevier Interactive Patient Education [employer] Inc.

## 2016-04-14 NOTE — ED Notes (Signed)
Pt with sore throat x 4 days.  No fever.  No n/v.

## 2016-04-16 LAB — CULTURE, GROUP A STREP (THRC)

## 2017-02-11 ENCOUNTER — Emergency Department (HOSPITAL_COMMUNITY): Payer: Self-pay

## 2017-02-11 ENCOUNTER — Emergency Department (HOSPITAL_COMMUNITY)
Admission: EM | Admit: 2017-02-11 | Discharge: 2017-02-11 | Disposition: A | Discharge status: Home / Self Care | Payer: Self-pay | Attending: Emergency Medicine | Admitting: Emergency Medicine

## 2017-02-11 ENCOUNTER — Encounter (HOSPITAL_COMMUNITY): Payer: Self-pay | Admitting: Emergency Medicine

## 2017-02-11 DIAGNOSIS — R791 Abnormal coagulation profile: Secondary | ICD-10-CM | POA: Insufficient documentation

## 2017-02-11 DIAGNOSIS — H5461 Unqualified visual loss, right eye, normal vision left eye: Secondary | ICD-10-CM | POA: Insufficient documentation

## 2017-02-11 DIAGNOSIS — F17210 Nicotine dependence, cigarettes, uncomplicated: Secondary | ICD-10-CM | POA: Insufficient documentation

## 2017-02-11 LAB — DIFFERENTIAL
BASOS ABS: 0 10*3/uL (ref 0.0–0.1)
BASOS PCT: 0 %
Eosinophils Absolute: 0.5 10*3/uL (ref 0.0–0.7)
Eosinophils Relative: 7 %
LYMPHS PCT: 48 %
Lymphs Abs: 3.4 10*3/uL (ref 0.7–4.0)
Monocytes Absolute: 0.5 10*3/uL (ref 0.1–1.0)
Monocytes Relative: 7 %
NEUTROS ABS: 2.7 10*3/uL (ref 1.7–7.7)
NEUTROS PCT: 38 %

## 2017-02-11 LAB — CBC
HCT: 43.4 % (ref 39.0–52.0)
Hemoglobin: 14.7 g/dL (ref 13.0–17.0)
MCH: 27.6 pg (ref 26.0–34.0)
MCHC: 33.9 g/dL (ref 30.0–36.0)
MCV: 81.4 fL (ref 78.0–100.0)
PLATELETS: 256 10*3/uL (ref 150–400)
RBC: 5.33 MIL/uL (ref 4.22–5.81)
RDW: 13.2 % (ref 11.5–15.5)
WBC: 7.1 10*3/uL (ref 4.0–10.5)

## 2017-02-11 LAB — CBG MONITORING, ED: Glucose-Capillary: 82 mg/dL (ref 65–99)

## 2017-02-11 LAB — APTT: APTT: 33 s (ref 24–36)

## 2017-02-11 LAB — BASIC METABOLIC PANEL
Anion gap: 8 (ref 5–15)
BUN: 13 mg/dL (ref 6–20)
CALCIUM: 9.1 mg/dL (ref 8.9–10.3)
CO2: 27 mmol/L (ref 22–32)
CREATININE: 1.3 mg/dL — AB (ref 0.61–1.24)
Chloride: 104 mmol/L (ref 101–111)
GFR calc Af Amer: >60 mL/min (ref >60)
GFR calc non Af Amer: >60 mL/min (ref >60)
Glucose, Bld: 99 mg/dL (ref 65–99)
Potassium: 4 mmol/L (ref 3.5–5.1)
SODIUM: 139 mmol/L (ref 135–145)

## 2017-02-11 LAB — I-STAT TROPONIN, ED: TROPONIN I, POC: 0 ng/mL (ref 0.00–0.08)

## 2017-02-11 LAB — PROTIME-INR
INR: 1
Prothrombin Time: 13.2 seconds (ref 11.4–15.2)

## 2017-02-11 MED ORDER — NAPROXEN 500 MG PO TABS
500.0000 mg | ORAL_TABLET | Freq: Once | ORAL | Status: AC
  Administered 2017-02-11: 500 mg via ORAL
  Filled 2017-02-11: qty 1

## 2017-02-11 MED ORDER — TETRACAINE HCL 0.5 % OP SOLN
2.0000 [drp] | Freq: Once | OPHTHALMIC | Status: AC
  Administered 2017-02-11: 2 [drp] via OPHTHALMIC
  Filled 2017-02-11: qty 4

## 2017-02-11 MED ORDER — FLUORESCEIN SODIUM 0.6 MG OP STRP
1.0000 | ORAL_STRIP | Freq: Once | OPHTHALMIC | Status: AC
  Administered 2017-02-11: 1 via OPHTHALMIC
  Filled 2017-02-11: qty 1

## 2017-02-11 NOTE — ED Triage Notes (Signed)
Pt c/o intermittent right eye blurriness, left arm numbness, polyuria, polydipsia x 4 days.

## 2017-02-11 NOTE — ED Provider Notes (Signed)
[facility]-EMERGENCY DEPT Provider Note   CSN: [id number] Arrival date & time: 02/11/17  1851     History   Chief Complaint Chief Complaint  Patient presents with  . Loss of Vision  . Numbness    HPI Steven Nash is a 31 y.o. male.  The history is provided by the patient.  Eye Problem   This is a new problem. Episode onset: 5 days ago. The problem occurs constantly. The problem has not changed since onset.There is a problem in the right eye. There was no injury mechanism. The pain is moderate. There is no history of trauma to the eye. There is no known exposure to pink eye. He does not wear contacts. Associated symptoms include blurred vision and decreased vision. Pertinent negatives include no double vision. Associated symptoms comments: Occasional headaches. He has tried nothing for the symptoms.    History reviewed. No pertinent past medical history.  There are no active problems to display for this patient.   Past Surgical History:  Procedure Laterality Date  . FOOT SURGERY         Home Medications    Prior to Admission medications   Medication Sig Start Date End Date Taking? Authorizing Provider  albuterol (PROVENTIL HFA;VENTOLIN HFA) 108 (90 BASE) MCG/ACT inhaler Inhale 2 puffs into the lungs every 4 (four) hours as needed for wheezing or shortness of breath. 09/12/15   Everlene Farrier, PA-C  ibuprofen (ADVIL,MOTRIN) 800 MG tablet Take 1 tablet (800 mg total) by mouth 3 (three) times daily. 04/14/16   Joycie Peek, PA-C    Family History No family history on file.  Social History Social History  Substance Use Topics  . Smoking status: Current Every Day Smoker    Types: Cigarettes  . Smokeless tobacco: Not on file  . Alcohol use Yes     Allergies   Patient has no known allergies.   Review of Systems Review of Systems  Eyes: Positive for blurred vision. Negative for double vision.  All other systems reviewed and are negative.    Physical  Exam Updated Vital Signs BP (!) 156/109 (BP Location: Left Arm)   Pulse 66   Temp 98 F (36.7 C) (Oral)   Resp 16   SpO2 100%   Physical Exam  Constitutional: He is oriented to person, place, and time. He appears well-developed and well-nourished. No distress.  HENT:  Head: Normocephalic and atraumatic.  Nose: Nose normal.  Eyes: Conjunctivae, EOM and lids are normal. Right eye exhibits no chemosis. Right conjunctiva is not injected. Right conjunctiva has no hemorrhage.  Fundoscopic exam:      The right eye shows hemorrhage (punctate central). The right eye shows no papilledema.  Slit lamp exam:      The right eye shows no corneal abrasion, no corneal ulcer, no hyphema, no hypopyon and no fluorescein uptake.  Neck: Neck supple. No tracheal deviation present.  Cardiovascular: Normal rate, regular rhythm and normal heart sounds.   Pulmonary/Chest: Effort normal and breath sounds normal. No respiratory distress.  Abdominal: Soft. He exhibits no distension.  Neurological: He is alert and oriented to person, place, and time.  Skin: Skin is warm and dry.  Psychiatric: He has a normal mood and affect.  Vitals reviewed.    ED Treatments / Results  Labs (all labs ordered are listed, but only abnormal results are displayed) Labs Reviewed  BASIC METABOLIC PANEL - Abnormal; Notable for the following:       Result Value   Creatinine,  Ser 1.30 (*)    All other components within normal limits  CBC  DIFFERENTIAL  PROTIME-INR  APTT  I-STAT TROPOININ, ED  CBG MONITORING, ED    EKG  EKG Interpretation  Date/Time:  Thursday February 11 2017 19:14:07 EDT Ventricular Rate:  69 PR Interval:    QRS Duration: 105 QT Interval:  383 QTC Calculation: 411 R Axis:   -21 Text Interpretation:  Sinus rhythm Borderline left axis deviation RSR' in V1 or V2, probably normal variant Baseline wander in lead(s) III aVF V6 Normal ECG Confirmed by Rowyn Mustapha MD, Reuel Boom ([id number]) on 02/11/2017 7:39:08 PM        Radiology Dg Chest 2 View  Result Date: 02/11/2017 CLINICAL DATA:  Intermittent RIGHT eye blurry vision, LEFT arm numbness, polyuria and polydipsia for 4 days. EXAM: CHEST  2 VIEW COMPARISON:  Chest radiograph December 18, 2015 FINDINGS: Cardiomediastinal silhouette is normal. No pleural effusions or focal consolidations. Trachea projects midline and there is no pneumothorax. Soft tissue planes and included osseous structures are non-suspicious. Large body habitus. IMPRESSION: Normal chest . Electronically Signed   By: Awilda Metro M.D.   On: 02/11/2017 19:27    Procedures Procedures (including critical care time)  EMERGENCY DEPARTMENT [place] OCULAR EXAM "Study: Limited Ultrasound of Orbit "  INDICATIONS: Vision loss Linear probe utilized to obtain images in both long and short axis of the orbit having the patient look left and right if possible.  PERFORMED BY: Myself IMAGES ARCHIVED?: Yes LIMITATIONS: none VIEWS USED: Right orbit INTERPRETATION: No retinal detachment, Lens in proper position, Normal optic nerve diameter   Medications Ordered in ED Medications  naproxen (NAPROSYN) tablet 500 mg (500 mg Oral Given 02/11/17 1946)  fluorescein ophthalmic strip 1 strip (1 strip Right Eye Given 02/11/17 1948)  tetracaine (PONTOCAINE) 0.5 % ophthalmic solution 2 drop (2 drops Right Eye Given 02/11/17 1948)     Initial Impression / Assessment and Plan / ED Course  I have reviewed the triage vital signs and the nursing notes.  Pertinent labs & imaging results that were available during my care of the patient were reviewed by me and considered in my medical decision making (see chart for details).     31 year old male presents with blurred vision in right eye and occasional headaches over the last 5 days. He states that his visual acuity has progressively been worsening. His left eye has not been affected at all. He has no known prior medical history. A stroke workup was started from  triage but this appears to be more of an ophthalmologic issue versus atypical migraine.  He is well-appearing, no evidence of glaucoma with IOP of 11, 14, 13 on repeat measurements of the right eye, no corneal abrasion on fluorescein stain. Questionable small retinal hemorrhage noted on funduscopic exam but ultrasound shows no evidence of vitreous hemorrhage or retinal detachment to explain his symptoms.  Recommended short-term and close ophthalmology follow-up for definitive funduscopic examination and treatment of headaches with NSAIDs. Plan to follow up with PCP as needed and return precautions discussed for worsening or new concerning symptoms.   Final Clinical Impressions(s) / ED Diagnoses   Final diagnoses:  Vision loss of right eye    New Prescriptions New Prescriptions   No medications on file     Lyndal Pulley, MD 02/11/17 2051

## 2018-08-11 IMAGING — CR DG CHEST 2V
2 series · 2 of 2 positions shown · non-contrast
Comparison: Chest radiograph December 18, 2015

CLINICAL DATA: Intermittent RIGHT eye blurry vision, LEFT arm
numbness, polyuria and polydipsia for 4 days.

EXAM:
CHEST  2 VIEW

[w chest pa]
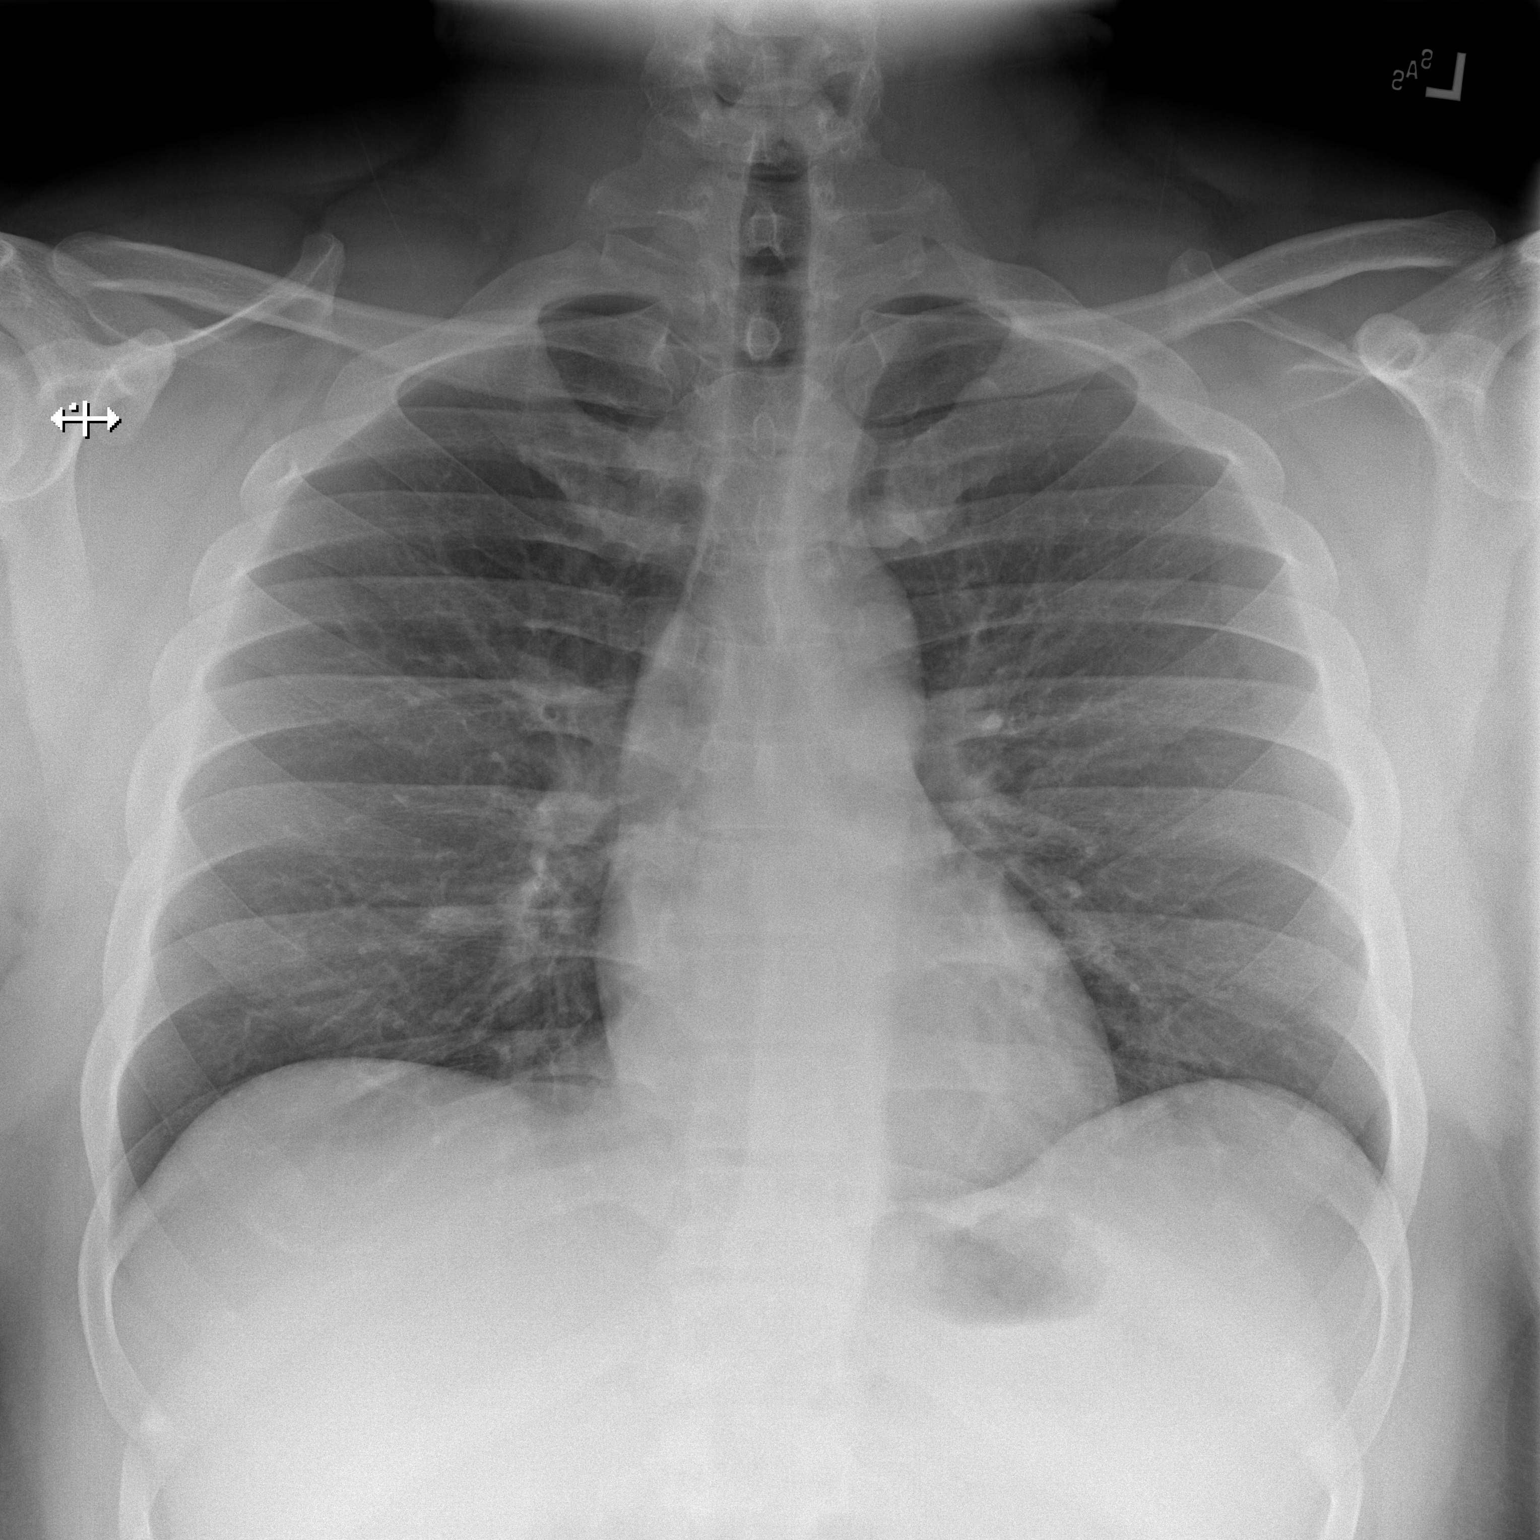

[w chest lat]
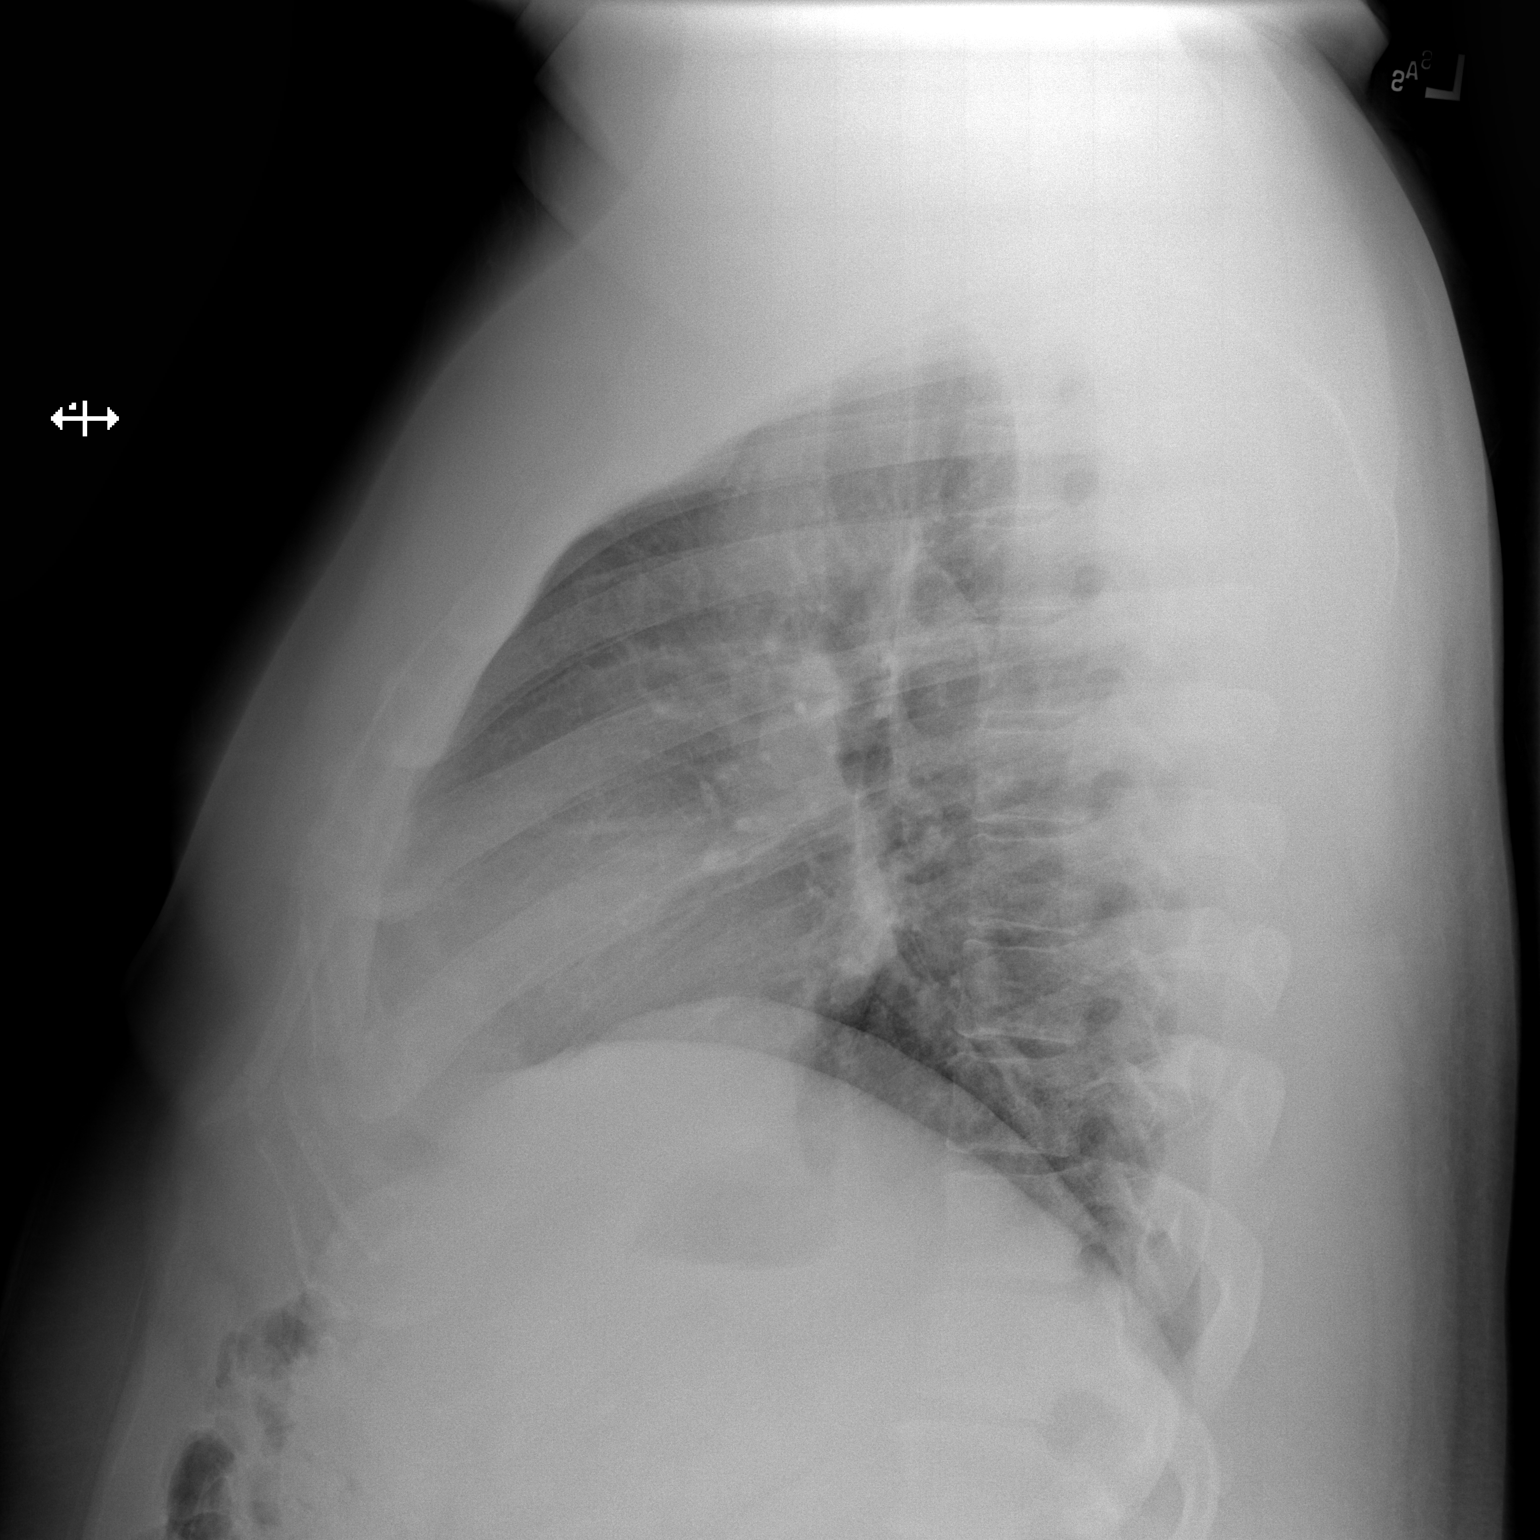

[2 of 2 positions shown; findings below may reference images not displayed]

FINDINGS: Cardiomediastinal silhouette is normal. No pleural effusions or
focal consolidations. Trachea projects midline and there is no
pneumothorax. Soft tissue planes and included osseous structures are
non-suspicious. Large body habitus.
IMPRESSION: Normal chest .
